# Patient Record
Sex: Male | Born: 1990 | Race: White | Hispanic: No | Marital: Single | Smoking: Current every day smoker
Health system: Southern US, Community
[De-identification: ages and names within clinical notes are randomized; demographics above are authoritative.]

## PROBLEM LIST (undated history)

## (undated) HISTORY — PX: ORIF ANKLE FRACTURE: SUR919

## (undated) HISTORY — PX: HERNIA REPAIR: SHX51

---
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
## 2011-05-04 NOTE — Group Therapy Note (Signed)
[name], [name]          ACCOUNT NO.:  [id number]   MEDICAL RECORD NO.:  [id number]          PATIENT TYPE:  INP   LOCATION:  A335                          FACILITY:  [facility]   PHYSICIAN:  Dorris Singh, DO    DATE OF BIRTH:  05/15/1991   DATE OF PROCEDURE:  07/13/2008  DATE OF DISCHARGE:                                 PROGRESS NOTE   The patient was seen today resting comfortably with family in the room.  Mother states he is feeling a little bit better.  He has not had any  repeat of the chest pain and discussed with him his labs findings which  he is now corrected for hyponatremia.   PHYSICAL EXAMINATION:  VITAL SIGNS:  Are as follows, temperature 98.7,  pulse 81, respirations 20, blood pressure 90/56.  GENERAL:  The patient is an 20 year old Caucasian male who is well-  developed, well-nourished and in no acute distress.  HEART:  Regular rate and rhythm.  LUNGS:  Clear to auscultation bilaterally.  ABDOMEN:  Soft, nontender and nondistended.  EXTREMITIES:  Left extremity positive swelling of the knee but  improvement from yesterday.   LABS:  For today are as follows.  His CBC, his white count is 9.4,  hemoglobin 10.6, hematocrit 30 and platelet count of 233.  His sodium is  136, his potassium is 3.5, chloride is 104, CO2 28, glucose 138, BUN 8  and creatinine 0.70.   ASSESSMENT AND PLAN:  1. Cellulitis in the left knee.  The patient seems to be doing well.  2. Right-sided chest pain.  The patient has not had a repeat episode.      His chest x-ray was negative.  3. Hyponatremia.  This is corrected.  4. Anemia.  He may have more an anemia of hemodilution.  Will go ahead      and turn down his IV rate today and if the patient continues to do      well we will consider signing off tomorrow.   Thank you very much for this fine consult.      Dorris Singh, DO  Electronically Signed     CB/MEDQ  D:  07/13/2008  T:  07/13/2008  Job:  [phone]

## 2011-05-04 NOTE — Group Therapy Note (Signed)
[name], [name]          ACCOUNT NO.:  [id number]   MEDICAL RECORD NO.:  [id number]          PATIENT TYPE:  INP   LOCATION:  A335                          FACILITY:  [facility]   PHYSICIAN:  Dorris Singh, DO    DATE OF BIRTH:  July 03, 1991   DATE OF PROCEDURE:  DATE OF DISCHARGE:                                 PROGRESS NOTE   The patient is seen today, sitting comfortably with his mother in the  room.  Discussed his chest x-ray of yesterday which was negative.  Also  increased the patient's fluid.  He seems to be doing okay with that.   PHYSICAL EXAMINATION:  VITAL SIGNS:  Today temperature 99.8 degrees,  pulse 103, respirations 20, blood pressure 84/54.  GENERAL:  The patient is a well-developed and well-nourished Caucasian  male, who is in no acute distress.  HEART:  Regular rate and rhythm.  LUNGS:  Clear to auscultation bilaterally.  ABDOMEN:  Soft, nontender, nondistended.  EXTREMITIES:  Left leg with positive swelling at the knee, as well as  some redness going up the thigh.   LABORATORY DATA:  White count 13.6, hemoglobin 11.7, hematocrit 35.5,  platelet count 225.   Will go ahead and run a BMET for tomorrow, to check his sodium level.   ASSESSMENT/PLAN:  1. Cellulitis of the left knee:  Continue with Zosyn and vancomycin.  2. Hyponatremia:  Will check to see if it is corrected for tomorrow.  3. Anemia:  Will continue to monitor.  This may be of chronic nature      and due to medications. Will stop H&H's today.  Will continue to      monitor him at that point in time.      Dorris Singh, DO  Electronically Signed     CB/MEDQ  D:  07/12/2008  T:  07/12/2008  Job:  [phone]

## 2011-05-04 NOTE — H&P (Signed)
[name], [name]          ACCOUNT NO.:  [id number]   MEDICAL RECORD NO.:  [id number]          PATIENT TYPE:  INP   LOCATION:  A335                          FACILITY:  [facility]   PHYSICIAN:  J. Darreld Mclean, M.D. DATE OF BIRTH:  Mar 22, 1991   DATE OF ADMISSION:  07/10/2008  DATE OF DISCHARGE:  LH                              HISTORY & PHYSICAL   CHIEF COMPLAINT:  My knee hurts and it is swollen.   HISTORY OF PRESENT ILLNESS:  The patient is an 20 year old white male  who fell and hit his left knee on the 18th.  He was seen in the  emergency room.  X-rays were negative.  I first saw him in the office  yesterday on July 21.  He was complaining of pain and tenderness in the  left knee.  It was swollen and red.  He had induration in the  prepatellar area.  I thought he had prepatellar bursitis.  Attempted an  aspiration, was unable to get any fluid.  I began him on Levaquin and  told his mother if the knee got worse or got redder or had pain, he was  to return.  I was going to plan to see them in the office tomorrow.  Mother called early this morning and said he was having pain and his  knee was worse.   Today, his knee was much redder, and he was having red streaks running  up his medial thigh.  He has no fever, but he is in pain.  His knee  looks worse today.  There is more induration.  No discharge, no  purulence.  Range of motion is decreased.   PAST HISTORY:  Negative.   ALLERGIES:  No known drug allergies.   MEDICATIONS:  1. Dexedrine 45 mg daily.  2. Trileptal 300 mg daily.  3. Seroquel 300 mg daily.  4. __________1 mg b.i.d..   PRIMARY CARE PHYSICIAN:  Dr. Renette Butters is family doctor.  He also [facility] to  [facility].   PAST SURGICAL HISTORY:  Denies any surgery.   PHYSICAL EXAMINATION:  GENERAL:  He is accompanied by his mother and he  lives in [place].  Patient alert, cooperative and oriented.  HEENT:  Negative.  NECK:  Supple.  LUNGS:  Clear to P&A.  HEART:  Regular without murmur.  ABDOMEN:  Soft, nontender without masses.  He is thin.  EXTREMITIES:  Left knee is swollen, red and tender with induration with  medial red streaks coming up and erythema up toward the groin medially.  Range of motion of the knee is 5 degrees to 40 degrees with pain.  The  knee is stable.  No distal edema.  Other extremities are negative.  CNS:  Intact.  SKIN:  Intact.   IMPRESSION:  Left knee cellulitis, prepatellar bursitis.  Probable MRSA.   PLAN:  I have ordered IV vancomycin.  I have ordered a PCA morphine  pump.  I have ordered a PICC line.  Labs are pending.  ______________________________  [name]. Darreld Mclean, M.D.     JWK/MEDQ  D:  07/10/2008  T:  07/10/2008  Job:  [id number]

## 2011-05-04 NOTE — Consult Note (Signed)
[name], [name]          ACCOUNT NO.:  [id number]   MEDICAL RECORD NO.:  [id number]          PATIENT TYPE:  INP   LOCATION:  A335                          FACILITY:  [facility]   PHYSICIAN:  Dorris Singh, DO    DATE OF BIRTH:  Feb 28, 1991   DATE OF CONSULTATION:  07/11/2008  DATE OF DISCHARGE:                                 CONSULTATION   We requested to see the patient for right-sided chest pain.  The patient  is an 20 year old Caucasian male who was admitted to [facility]  after being seen in the office by Dr. Hilda Lias for knee pain.  He was  admitted for left knee cellulitis, prepatellar bursitis with MRSA.  Yesterday, on July 22, the patient had PICC line placement and no  complications after the procedure.  Today, the patient was complaining  of sharp right-sided chest pain that still felt like stabbing in nature;  he had never had it before.  He was concerned about the PICC line  placement.  Denied any shortness of breath associated with it.  He did  get nauseated and was given Maalox and is doing well.   His past medical history:  Has no known drug allergies.  He has a  significant psychiatric history that includes major depression, bipolar  disorder as well as attention deficit disorder.  He also has a history  of asthma and right shoulder pain.  He is currently on Dexedrine 45 mg  p.o. daily, Trileptal 300 mg p.o. daily, Seroquel 300 mg p.o. daily and  __________ 1 mg 2 times p.o. daily.   His surgical history:  He has had an inguinal hernia repair at age 75  months.   His social history:  He lives with his mother and 3 other siblings.  At  this point in time, denies any alcohol use or tobacco use at this point  in time.   The patient's review of systems positive for right-sided chest pain and  nausea and vomiting.  His vital signs are as follows:  Temperature 97, pulse 71, respirations  18, blood pressure 93/53.  GENERAL:  The patient is an 20 year old  Caucasian male who answers  questions appropriately.  He is well nourished in no acute distress.  HEAD:  Is normocephalic, atraumatic.  EYES:  Are PERRL.  EOMI.  MOUTH:  His mucosa is dry.  NECK:  Is supple without any lymphadenopathy.  CHEST:  On palpation of left side of the chest, there is no pain on  palpation.  Right side of the chest, the patient is stating that is  reproducible pain.  His lungs are clear to auscultation bilaterally.  HEART:  Is regular rate and rhythm.  ABDOMEN:  Is soft, nontender, nondistended.  EXTREMITIES:  Left knee is red and swollen.  Right knee is within normal  limits.   His labs for today include CBC 15.  His white count is 15, hemoglobin  11.9, hematocrit 35 and platelet count of 222.  His sodium is 130,  potassium is 4 - this was yesterday.  His chloride is 98, CO2 23,  glucose is 137, BUN  10, creatinine 0.83.   ASSESSMENT AND PLAN:  1. Right-sided chest pain.  2. Anemia, etiology unknown.  3. Hyponatremia.  4. Elevated ESR.   PLAN:  Will go ahead and obtain another chest x-ray for this right sided  chest pain.  I do suspect, though, that it is probably more  musculoskeletal in nature.  Will continue with pain medication and also  place him on a NSAID to help.  If his chest x-ray is negative, will just  continue with the NSAIDs.  Also for his hyponatremia recommend that his  IV rate be increased so this can be corrected. His anemia, do not know  if this is chronic or not.  It is looks like a normocytic, normochromic  anemia.  This may need to be worked up outpatient, and it may be due to  the series of medications that he is currently on.  However, will get  Hemoccult stools to make sure there is not an active bleed based on the  medications that the patient is on as well and will do some serial H&Hs  for 24 hours.   Thank you for this kind consult.      Dorris Singh, DO  Electronically Signed     CB/MEDQ  D:  07/11/2008  T:   07/11/2008  Job:  [id number]
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
## 2011-09-17 LAB — DIFFERENTIAL
Basophils Absolute: 0
Basophils Absolute: 0
Basophils Absolute: 0
Basophils Absolute: 0
Basophils Absolute: 0
Basophils Absolute: 0
Basophils Relative: 0
Basophils Relative: 0
Basophils Relative: 0
Basophils Relative: 0
Basophils Relative: 0
Eosinophils Absolute: 0
Eosinophils Absolute: 0.1
Eosinophils Absolute: 0.2
Eosinophils Absolute: 0.2
Eosinophils Absolute: 0.2
Eosinophils Absolute: 0.2
Eosinophils Relative: 0
Eosinophils Relative: 1
Eosinophils Relative: 1
Eosinophils Relative: 1
Eosinophils Relative: 2
Eosinophils Relative: 2
Lymphocytes Relative: 13
Lymphocytes Relative: 13
Lymphocytes Relative: 14
Lymphocytes Relative: 16
Lymphocytes Relative: 8 — ABNORMAL LOW
Lymphocytes Relative: 8 — ABNORMAL LOW
Lymphocytes Relative: 8 — ABNORMAL LOW
Lymphs Abs: 1.1
Lymphs Abs: 1.1
Lymphs Abs: 1.3
Lymphs Abs: 1.3
Lymphs Abs: 1.4
Lymphs Abs: 1.4
Monocytes Absolute: 1.4 — ABNORMAL HIGH
Monocytes Absolute: 1.4 — ABNORMAL HIGH
Monocytes Absolute: 1.4 — ABNORMAL HIGH
Monocytes Absolute: 1.5 — ABNORMAL HIGH
Monocytes Absolute: 1.5 — ABNORMAL HIGH
Monocytes Relative: 11
Monocytes Relative: 13 — ABNORMAL HIGH
Monocytes Relative: 14 — ABNORMAL HIGH
Monocytes Relative: 15 — ABNORMAL HIGH
Monocytes Relative: 8
Monocytes Relative: 9
Neutro Abs: 10.9 — ABNORMAL HIGH
Neutro Abs: 12.3 — ABNORMAL HIGH
Neutro Abs: 13.8 — ABNORMAL HIGH
Neutro Abs: 6.5
Neutro Abs: 8 — ABNORMAL HIGH
Neutrophils Relative %: 69
Neutrophils Relative %: 70
Neutrophils Relative %: 71
Neutrophils Relative %: 72
Neutrophils Relative %: 80 — ABNORMAL HIGH
Neutrophils Relative %: 82 — ABNORMAL HIGH
Neutrophils Relative %: 83 — ABNORMAL HIGH

## 2011-09-17 LAB — CBC
HCT: 30 — ABNORMAL LOW
HCT: 32.5 — ABNORMAL LOW
HCT: 33.4 — ABNORMAL LOW
HCT: 35 — ABNORMAL LOW
HCT: 35.5 — ABNORMAL LOW
HCT: 37.6 — ABNORMAL LOW
Hemoglobin: 10.6 — ABNORMAL LOW
Hemoglobin: 10.9 — ABNORMAL LOW
Hemoglobin: 11.7 — ABNORMAL LOW
Hemoglobin: 11.9 — ABNORMAL LOW
Hemoglobin: 12.6 — ABNORMAL LOW
MCHC: 33
MCHC: 33.6
MCHC: 33.7
MCHC: 34
MCHC: 35.2
MCV: 85.7
MCV: 88.1
MCV: 88.6
MCV: 88.8
MCV: 90.2
MCV: 93.4
Platelets: 221
Platelets: 222
Platelets: 225
Platelets: 233
Platelets: 270
Platelets: 289
Platelets: 320
RBC: 3.21 — ABNORMAL LOW
RBC: 3.66 — ABNORMAL LOW
RBC: 3.89 — ABNORMAL LOW
RBC: 3.98 — ABNORMAL LOW
RBC: 4 — ABNORMAL LOW
RBC: 4.38
RDW: 13
RDW: 13.2
RDW: 13.6
RDW: 13.7
RDW: 13.9
RDW: 14
WBC: 10
WBC: 11.1 — ABNORMAL HIGH
WBC: 13.6 — ABNORMAL HIGH
WBC: 15 — ABNORMAL HIGH
WBC: 16.6 — ABNORMAL HIGH
WBC: 9.4

## 2011-09-17 LAB — BASIC METABOLIC PANEL
BUN: 10
BUN: 7
BUN: 8
CO2: 23
CO2: 25
CO2: 28
Calcium: 8.5
Calcium: 8.6
Calcium: 9
Chloride: 100
Chloride: 104
Chloride: 98
Creatinine, Ser: 0.7
Creatinine, Ser: 0.71
Creatinine, Ser: 0.83
GFR calc Af Amer: >60
GFR calc Af Amer: >60
GFR calc Af Amer: >60
GFR calc non Af Amer: >60
GFR calc non Af Amer: >60
GFR calc non Af Amer: >60
Glucose, Bld: 117 — ABNORMAL HIGH
Glucose, Bld: 137 — ABNORMAL HIGH
Glucose, Bld: 138 — ABNORMAL HIGH
Potassium: 3.5
Potassium: 3.7
Potassium: 4
Sodium: 130 — ABNORMAL LOW
Sodium: 132 — ABNORMAL LOW
Sodium: 136

## 2011-09-17 LAB — URINALYSIS, ROUTINE W REFLEX MICROSCOPIC
Bilirubin Urine: NEGATIVE
Glucose, UA: NEGATIVE
Hgb urine dipstick: NEGATIVE
Ketones, ur: NEGATIVE
Nitrite: NEGATIVE
Protein, ur: NEGATIVE
Specific Gravity, Urine: 1.01
Urobilinogen, UA: 0.2
pH: 7.5

## 2011-09-17 LAB — HEMOGLOBIN AND HEMATOCRIT, BLOOD
HCT: 31.2 — ABNORMAL LOW
HCT: 32.8 — ABNORMAL LOW
Hemoglobin: 10.7 — ABNORMAL LOW
Hemoglobin: 11 — ABNORMAL LOW

## 2011-09-17 LAB — WOUND CULTURE

## 2011-09-17 LAB — VANCOMYCIN, RANDOM: Vancomycin Rm: 18.1

## 2011-09-17 LAB — SEDIMENTATION RATE
Sed Rate: 75 — ABNORMAL HIGH
Sed Rate: 98 — ABNORMAL HIGH

## 2019-08-04 ENCOUNTER — Encounter: Payer: Self-pay | Admitting: *Deleted

## 2019-08-04 ENCOUNTER — Emergency Department
Admission: EM | Admit: 2019-08-04 | Discharge: 2019-08-04 | Disposition: A | Discharge status: Home / Self Care | Payer: Medicare Other | Attending: Emergency Medicine | Admitting: Emergency Medicine

## 2019-08-04 ENCOUNTER — Emergency Department: Payer: Medicare Other

## 2019-08-04 ENCOUNTER — Other Ambulatory Visit: Payer: Self-pay

## 2019-08-04 DIAGNOSIS — F17200 Nicotine dependence, unspecified, uncomplicated: Secondary | ICD-10-CM | POA: Diagnosis not present

## 2019-08-04 DIAGNOSIS — Y999 Unspecified external cause status: Secondary | ICD-10-CM | POA: Insufficient documentation

## 2019-08-04 DIAGNOSIS — Y9389 Activity, other specified: Secondary | ICD-10-CM | POA: Insufficient documentation

## 2019-08-04 DIAGNOSIS — S82831A Other fracture of upper and lower end of right fibula, initial encounter for closed fracture: Secondary | ICD-10-CM | POA: Diagnosis not present

## 2019-08-04 DIAGNOSIS — Y929 Unspecified place or not applicable: Secondary | ICD-10-CM | POA: Insufficient documentation

## 2019-08-04 DIAGNOSIS — S8991XA Unspecified injury of right lower leg, initial encounter: Secondary | ICD-10-CM | POA: Diagnosis present

## 2019-08-04 DIAGNOSIS — W010XXA Fall on same level from slipping, tripping and stumbling without subsequent striking against object, initial encounter: Secondary | ICD-10-CM | POA: Diagnosis not present

## 2019-08-04 MED ORDER — HYDROCODONE-ACETAMINOPHEN 5-325 MG PO TABS: 2.0000 | ORAL_TABLET | Freq: Four times a day (QID) | ORAL | 0 refills | Status: DC | PRN
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
## 2019-08-04 NOTE — ED Notes (Signed)
Pt to Xray at this time

## 2019-08-04 NOTE — ED Notes (Signed)
MD Forbach at bedside. 
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
## 2019-08-04 NOTE — Discharge Instructions (Addendum)
You have a fracture (a break) in 1 of the bones of your lower leg called the fibula.  You need to not bear any weight on this leg and leave the splint in place.  Keep the splint clean and dry and use crutches to get around.  Keep the leg elevated when possible and you can use ice packs through the splint to help with pain and swelling.  Use over-the-counter pain medication according to label instructions.  It is important that you follow up with orthopedics; call the office of Dr. Roland Rack on Monday to schedule an appointment for this coming week.  Return to the emergency department with new or worsening symptoms that concern you.  Take Norco as prescribed for severe pain. Do not drink alcohol, drive or participate in any other potentially dangerous activities while taking this medication as it may make you sleepy. Do not take this medication with any other sedating medications, either prescription or over-the-counter. If you were prescribed Percocet or Vicodin, do not take these with acetaminophen (Tylenol) as it is already contained within these medications.   This medication is an opiate (or narcotic) pain medication and can be habit forming.  Use it as little as possible to achieve adequate pain control.  Do not use or use it with extreme caution if you have a history of opiate abuse or dependence.  If you are on a pain contract with your primary care doctor or a pain specialist, be sure to let them know you were prescribed this medication today from the [facility] Emergency Department.  This medication is intended for your use only - do not give any to anyone else and keep it in a secure place where nobody else, especially children, have access to it.  It will also cause or worsen constipation, so you may want to consider taking an over-the-counter stool softener while you are taking this medication.
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
## 2021-02-25 ENCOUNTER — Other Ambulatory Visit (HOSPITAL_COMMUNITY): Payer: Medicare Other

## 2021-02-27 ENCOUNTER — Encounter (HOSPITAL_COMMUNITY): Admission: RE | Payer: Self-pay | Source: Home / Self Care

## 2021-02-27 ENCOUNTER — Ambulatory Visit (HOSPITAL_COMMUNITY): Admission: RE | Admit: 2021-02-27 | Payer: Medicare Other | Source: Home / Self Care | Admitting: Oral Surgery

## 2021-02-27 SURGERY — DENTAL RESTORATION/EXTRACTIONS
Anesthesia: General | Laterality: Bilateral

## 2021-03-26 NOTE — Progress Notes (Signed)
I called Mr. Turpen cell phone, patient's mother answered. Mrs. Lancon states that Mr. Kuenzi does not have a cell phone and he is not home.  I gave Mrs. Kohrs the address to take patient to have Covid: Mrs. Rathgeber said she is not taking him that far. Mrs. Wuebker said she will have Mr. Waldman call me.

## 2021-03-26 NOTE — Progress Notes (Addendum)
Mr. [name] well is not at home, I gave the following instructions with his mother, Kaoru Rezendes: NPO after midnight shower, wear clean clothes, no lotions, powders  or cologne, brush teeth. No jewelry or piercing. Patient will need someone to drive him home and stay with him for 24 hours.

## 2021-03-27 ENCOUNTER — Ambulatory Visit (HOSPITAL_COMMUNITY)
Admission: RE | Admit: 2021-03-27 | Discharge: 2021-03-27 | Disposition: A | Discharge status: Home / Self Care | Payer: Medicare Other | Source: Ambulatory Visit | Attending: Oral Surgery | Admitting: Oral Surgery

## 2021-03-27 ENCOUNTER — Ambulatory Visit (HOSPITAL_COMMUNITY): Payer: Medicare Other | Admitting: Certified Registered"

## 2021-03-27 ENCOUNTER — Other Ambulatory Visit: Payer: Self-pay

## 2021-03-27 ENCOUNTER — Encounter (HOSPITAL_COMMUNITY): Payer: Self-pay | Admitting: Oral Surgery

## 2021-03-27 ENCOUNTER — Encounter (HOSPITAL_COMMUNITY)
Admission: RE | Disposition: A | Discharge status: Home / Self Care | Payer: Self-pay | Source: Ambulatory Visit | Attending: Oral Surgery

## 2021-03-27 DIAGNOSIS — K029 Dental caries, unspecified: Secondary | ICD-10-CM | POA: Insufficient documentation

## 2021-03-27 DIAGNOSIS — Z888 Allergy status to other drugs, medicaments and biological substances status: Secondary | ICD-10-CM | POA: Diagnosis not present

## 2021-03-27 DIAGNOSIS — F17200 Nicotine dependence, unspecified, uncomplicated: Secondary | ICD-10-CM | POA: Diagnosis not present

## 2021-03-27 DIAGNOSIS — Z885 Allergy status to narcotic agent status: Secondary | ICD-10-CM | POA: Insufficient documentation

## 2021-03-27 DIAGNOSIS — Z20822 Contact with and (suspected) exposure to covid-19: Secondary | ICD-10-CM | POA: Diagnosis not present

## 2021-03-27 DIAGNOSIS — K011 Impacted teeth: Secondary | ICD-10-CM | POA: Diagnosis not present

## 2021-03-27 DIAGNOSIS — K0530 Chronic periodontitis, unspecified: Secondary | ICD-10-CM | POA: Insufficient documentation

## 2021-03-27 HISTORY — PX: TOOTH EXTRACTION: SHX859

## 2021-03-27 LAB — SARS CORONAVIRUS 2 BY RT PCR (HOSPITAL ORDER, PERFORMED IN ~~LOC~~ HOSPITAL LAB): SARS Coronavirus 2: NEGATIVE

## 2021-03-27 LAB — COMPREHENSIVE METABOLIC PANEL
ALT: 64 U/L — ABNORMAL HIGH (ref 0–44)
AST: 45 U/L — ABNORMAL HIGH (ref 15–41)
Albumin: 4.2 g/dL (ref 3.5–5.0)
Alkaline Phosphatase: 91 U/L (ref 38–126)
Anion gap: 11 (ref 5–15)
BUN: 9 mg/dL (ref 6–20)
CO2: 23 mmol/L (ref 22–32)
Calcium: 9.5 mg/dL (ref 8.9–10.3)
Chloride: 103 mmol/L (ref 98–111)
Creatinine, Ser: 0.94 mg/dL (ref 0.61–1.24)
GFR, Estimated: >60 mL/min (ref >60)
Glucose, Bld: 88 mg/dL (ref 70–99)
Potassium: 4.2 mmol/L (ref 3.5–5.1)
Sodium: 137 mmol/L (ref 135–145)
Total Bilirubin: 0.5 mg/dL (ref 0.3–1.2)
Total Protein: 7 g/dL (ref 6.5–8.1)

## 2021-03-27 SURGERY — DENTAL RESTORATION/EXTRACTIONS
Anesthesia: General

## 2021-03-27 MED ORDER — OXYMETAZOLINE HCL 0.05 % NA SOLN
NASAL | Status: AC
  Filled 2021-03-27: qty 30

## 2021-03-27 MED ORDER — MIDAZOLAM HCL 5 MG/5ML IJ SOLN
INTRAMUSCULAR | Status: DC | PRN
  Administered 2021-03-27: 2 mg via INTRAVENOUS

## 2021-03-27 MED ORDER — GLYCOPYRROLATE PF 0.2 MG/ML IJ SOSY
PREFILLED_SYRINGE | INTRAMUSCULAR | Status: DC | PRN
  Administered 2021-03-27: .4 mg via INTRAVENOUS

## 2021-03-27 MED ORDER — ORAL CARE MOUTH RINSE: 15.0000 mL | Freq: Once | OROMUCOSAL | Status: AC

## 2021-03-27 MED ORDER — PROPOFOL 10 MG/ML IV BOLUS
INTRAVENOUS | Status: DC | PRN
  Administered 2021-03-27: 200 mg via INTRAVENOUS

## 2021-03-27 MED ORDER — ROCURONIUM BROMIDE 10 MG/ML (PF) SYRINGE
PREFILLED_SYRINGE | INTRAVENOUS | Status: DC | PRN
  Administered 2021-03-27: 50 mg via INTRAVENOUS

## 2021-03-27 MED ORDER — OXYCODONE HCL 5 MG/5ML PO SOLN
5.0000 mg | Freq: Once | ORAL | Status: DC | PRN
Start: 2021-03-27 — End: 2021-03-27

## 2021-03-27 MED ORDER — DEXAMETHASONE SODIUM PHOSPHATE 10 MG/ML IJ SOLN
INTRAMUSCULAR | Status: DC | PRN
  Administered 2021-03-27: 10 mg via INTRAVENOUS

## 2021-03-27 MED ORDER — MIDAZOLAM HCL 2 MG/2ML IJ SOLN
INTRAMUSCULAR | Status: AC
  Filled 2021-03-27: qty 2

## 2021-03-27 MED ORDER — CHLORHEXIDINE GLUCONATE 0.12 % MT SOLN: 15.0000 mL | Freq: Once | OROMUCOSAL | Status: AC

## 2021-03-27 MED ORDER — LIDOCAINE 2% (20 MG/ML) 5 ML SYRINGE
INTRAMUSCULAR | Status: DC | PRN
  Administered 2021-03-27: 60 mg via INTRAVENOUS

## 2021-03-27 MED ORDER — ACETAMINOPHEN 500 MG PO TABS
1000.0000 mg | ORAL_TABLET | Freq: Once | ORAL | Status: AC
  Administered 2021-03-27: 1000 mg via ORAL
  Filled 2021-03-27: qty 2

## 2021-03-27 MED ORDER — SUCCINYLCHOLINE CHLORIDE 20 MG/ML IJ SOLN
INTRAMUSCULAR | Status: DC | PRN
  Administered 2021-03-27: 100 mg via INTRAVENOUS

## 2021-03-27 MED ORDER — LIDOCAINE-EPINEPHRINE 2 %-1:100000 IJ SOLN
INTRAMUSCULAR | Status: AC
  Filled 2021-03-27: qty 1

## 2021-03-27 MED ORDER — FENTANYL CITRATE (PF) 250 MCG/5ML IJ SOLN
INTRAMUSCULAR | Status: AC
  Filled 2021-03-27: qty 5

## 2021-03-27 MED ORDER — FENTANYL CITRATE (PF) 100 MCG/2ML IJ SOLN
INTRAMUSCULAR | Status: DC | PRN
  Administered 2021-03-27: 50 ug via INTRAVENOUS
  Administered 2021-03-27: 100 ug via INTRAVENOUS

## 2021-03-27 MED ORDER — EPHEDRINE 5 MG/ML INJ
INTRAVENOUS | Status: AC
  Filled 2021-03-27: qty 10

## 2021-03-27 MED ORDER — SUCCINYLCHOLINE CHLORIDE 200 MG/10ML IV SOSY
PREFILLED_SYRINGE | INTRAVENOUS | Status: AC
  Filled 2021-03-27: qty 10

## 2021-03-27 MED ORDER — NEOSTIGMINE METHYLSULFATE 10 MG/10ML IV SOLN
INTRAVENOUS | Status: DC | PRN
  Administered 2021-03-27: 3 mg via INTRAVENOUS

## 2021-03-27 MED ORDER — SODIUM CHLORIDE 0.9 % IR SOLN
Status: DC | PRN
  Administered 2021-03-27: 1000 mL

## 2021-03-27 MED ORDER — LACTATED RINGERS IV SOLN: INTRAVENOUS | Status: DC

## 2021-03-27 MED ORDER — ONDANSETRON HCL 4 MG/2ML IJ SOLN
INTRAMUSCULAR | Status: DC | PRN
  Administered 2021-03-27: 4 mg via INTRAVENOUS

## 2021-03-27 MED ORDER — EPHEDRINE SULFATE-NACL 50-0.9 MG/10ML-% IV SOSY
PREFILLED_SYRINGE | INTRAVENOUS | Status: DC | PRN
  Administered 2021-03-27: 10 mg via INTRAVENOUS
  Administered 2021-03-27: 5 mg via INTRAVENOUS

## 2021-03-27 MED ORDER — PHENYLEPHRINE 40 MCG/ML (10ML) SYRINGE FOR IV PUSH (FOR BLOOD PRESSURE SUPPORT)
PREFILLED_SYRINGE | INTRAVENOUS | Status: AC
  Filled 2021-03-27: qty 10

## 2021-03-27 MED ORDER — PROPOFOL 10 MG/ML IV BOLUS
INTRAVENOUS | Status: AC
  Filled 2021-03-27: qty 20

## 2021-03-27 MED ORDER — OXYCODONE HCL 5 MG PO TABS: 5.0000 mg | ORAL_TABLET | Freq: Once | ORAL | Status: DC | PRN

## 2021-03-27 MED ORDER — MEPERIDINE HCL 25 MG/ML IJ SOLN: 6.2500 mg | INTRAMUSCULAR | Status: DC | PRN

## 2021-03-27 MED ORDER — CEFAZOLIN SODIUM-DEXTROSE 2-4 GM/100ML-% IV SOLN
2.0000 g | INTRAVENOUS | Status: AC
  Administered 2021-03-27: 2 g via INTRAVENOUS
  Filled 2021-03-27: qty 100

## 2021-03-27 MED ORDER — LIDOCAINE-EPINEPHRINE 2 %-1:100000 IJ SOLN
INTRAMUSCULAR | Status: DC | PRN
  Administered 2021-03-27: 10 mL via INTRADERMAL
  Administered 2021-03-27: 20 mL via INTRADERMAL

## 2021-03-27 MED ORDER — 0.9 % SODIUM CHLORIDE (POUR BTL) OPTIME
TOPICAL | Status: DC | PRN
  Administered 2021-03-27: 1000 mL

## 2021-03-27 MED ORDER — OXYCODONE-ACETAMINOPHEN 5-325 MG PO TABS: 1.0000 | ORAL_TABLET | ORAL | 0 refills | Status: AC | PRN

## 2021-03-27 MED ORDER — HYDROMORPHONE HCL 1 MG/ML IJ SOLN: 0.2500 mg | INTRAMUSCULAR | Status: DC | PRN

## 2021-03-27 MED ORDER — CHLORHEXIDINE GLUCONATE 0.12 % MT SOLN
OROMUCOSAL | Status: AC
  Administered 2021-03-27: 15 mL
  Filled 2021-03-27: qty 15

## 2021-03-27 MED ORDER — LIDOCAINE 2% (20 MG/ML) 5 ML SYRINGE
INTRAMUSCULAR | Status: AC
  Filled 2021-03-27: qty 5

## 2021-03-27 MED ORDER — PROMETHAZINE HCL 25 MG/ML IJ SOLN: 6.2500 mg | INTRAMUSCULAR | Status: DC | PRN

## 2021-03-27 MED ORDER — AMOXICILLIN 500 MG PO CAPS: 500.0000 mg | ORAL_CAPSULE | Freq: Three times a day (TID) | ORAL | 0 refills | Status: AC

## 2021-03-27 MED ORDER — KETOROLAC TROMETHAMINE 30 MG/ML IJ SOLN: 30.0000 mg | Freq: Once | INTRAMUSCULAR | Status: DC | PRN

## 2021-03-27 SURGICAL SUPPLY — 35 items
BLADE SURG 15 STRL LF DISP TIS (BLADE) ×1 IMPLANT
BLADE SURG 15 STRL SS (BLADE) ×2
BUR CROSS CUT FISSURE 1.6 (BURR) ×2 IMPLANT
BUR EGG ELITE 4.0 (BURR) ×2 IMPLANT
CANISTER SUCT 3000ML PPV (MISCELLANEOUS) ×2 IMPLANT
COVER SURGICAL LIGHT HANDLE (MISCELLANEOUS) ×2 IMPLANT
COVER WAND RF STERILE (DRAPES) IMPLANT
DECANTER SPIKE VIAL GLASS SM (MISCELLANEOUS) ×2 IMPLANT
DRAPE U-SHAPE 76X120 STRL (DRAPES) ×2 IMPLANT
GAUZE PACKING FOLDED 2  STR (GAUZE/BANDAGES/DRESSINGS) ×2
GAUZE PACKING FOLDED 2 STR (GAUZE/BANDAGES/DRESSINGS) ×1 IMPLANT
GLOVE BIO SURGEON STRL SZ 6.5 (GLOVE) ×2 IMPLANT
GLOVE BIO SURGEON STRL SZ7 (GLOVE) IMPLANT
GLOVE BIO SURGEON STRL SZ8 (GLOVE) ×2 IMPLANT
GLOVE SURG UNDER POLY LF SZ6.5 (GLOVE) IMPLANT
GLOVE SURG UNDER POLY LF SZ7 (GLOVE) IMPLANT
GOWN STRL REUS W/ TWL LRG LVL3 (GOWN DISPOSABLE) ×1 IMPLANT
GOWN STRL REUS W/ TWL XL LVL3 (GOWN DISPOSABLE) ×1 IMPLANT
GOWN STRL REUS W/TWL LRG LVL3 (GOWN DISPOSABLE) ×2
GOWN STRL REUS W/TWL XL LVL3 (GOWN DISPOSABLE) ×2
IV NS 1000ML (IV SOLUTION) ×2
IV NS 1000ML BAXH (IV SOLUTION) ×1 IMPLANT
KIT BASIN OR (CUSTOM PROCEDURE TRAY) ×2 IMPLANT
KIT TURNOVER KIT B (KITS) ×2 IMPLANT
NEEDLE HYPO 25GX1X1/2 BEV (NEEDLE) ×4 IMPLANT
NS IRRIG 1000ML POUR BTL (IV SOLUTION) ×2 IMPLANT
PAD ARMBOARD 7.5X6 YLW CONV (MISCELLANEOUS) ×2 IMPLANT
SLEEVE IRRIGATION ELITE 7 (MISCELLANEOUS) ×2 IMPLANT
SPONGE SURGIFOAM ABS GEL 12-7 (HEMOSTASIS) IMPLANT
SUT CHROMIC 3 0 PS 2 (SUTURE) ×2 IMPLANT
SYR BULB IRRIG 60ML STRL (SYRINGE) ×2 IMPLANT
SYR CONTROL 10ML LL (SYRINGE) ×2 IMPLANT
TRAY ENT MC OR (CUSTOM PROCEDURE TRAY) ×2 IMPLANT
TUBING IRRIGATION (MISCELLANEOUS) ×2 IMPLANT
YANKAUER SUCT BULB TIP NO VENT (SUCTIONS) ×2 IMPLANT

## 2021-03-27 NOTE — Op Note (Signed)
03/27/2021  10:29 AM  PATIENT:  [name]  30 y.o. male  PRE-OPERATIVE DIAGNOSIS:  DENTAL CARIES/PERIODONTITIS ALL REMAINING TEETH # 3, 4, 5, 6, 7, 8, 9, 10, 11, 12, 13, 14, 15, 17, 18, 19, 20, 21, 22, 23, 25, 26, 27, 28, 29, 31, 32, IMPACTED TOOTH #16.  POST-OPERATIVE DIAGNOSIS:  SAME  PROCEDURE:  Procedure(s): DENTAL EXTRACTION  TEETH # 3, 4, 5, 6, 7, 8, 9, 10, 11, 12, 13, 14, 15, 16 17, 18, 19, 20, 21, 22, 23, 25, 26, 27, 28, 29, 31, 32, ALVEOLOPLASTY RIGHT AND LEFT MAXILLA AND MANDIBLE  SURGEON:  Surgeon(s): Ocie Doyne, DMD  ANESTHESIA:   local and general  EBL:  minimal  DRAINS: none   SPECIMEN:  No Specimen  COUNTS:  YES  PLAN OF CARE: Discharge to home after PACU  PATIENT DISPOSITION:  PACU - hemodynamically stable.   PROCEDURE DETAILS: Dictation #[id number]  [name], DMD 03/27/2021 10:29 AM

## 2021-03-27 NOTE — Progress Notes (Signed)
Discharge criteria met. Patient is awake and alert, vital signs stable. Patient reports no pain at this time. Patients oral oozing seems to have slowed. Gauze replaced, patient instructed to keep gauze in place until arriving home and to call MD if bleeding starts again.  Discharge instructions printed and discussed with patient and patients mother "Mickel Baas". Copy of instructions provided to patient. Prescription medications to be picked up by patients mother on way home. Patient assisted with dressing, peripheral IV removed. Patient escorted to lobby by RN via wheelchair, patients mother to drive patient home.

## 2021-03-27 NOTE — Anesthesia Procedure Notes (Signed)
Procedure Name: Intubation Date/Time: 03/27/2021 9:09 AM Performed by: Lytle Michaels, CRNA Pre-anesthesia Checklist: Patient identified, Emergency Drugs available, Suction available and Patient being monitored Patient Re-evaluated:Patient Re-evaluated prior to induction Oxygen Delivery Method: Circle system utilized Preoxygenation: Pre-oxygenation with 100% oxygen Induction Type: IV induction Ventilation: Mask ventilation without difficulty Laryngoscope Size: Miller and 2 Grade View: Grade I Nasal Tubes: Nasal prep performed, Nasal Rae and Right Tube size: 7.5 mm Number of attempts: 2 (Cuff popped on first attempt) Placement Confirmation: ETT inserted through vocal cords under direct vision,  positive ETCO2 and breath sounds checked- equal and bilateral Secured at: 29 cm Tube secured with: Tape Dental Injury: Teeth and Oropharynx as per pre-operative assessment

## 2021-03-27 NOTE — Anesthesia Postprocedure Evaluation (Signed)
Anesthesia Post Note  Patient: ONEY FOLZ  Procedure(s) Performed: DENTAL RESTORATION/EXTRACTIONS (N/A )     Patient location during evaluation: PACU Anesthesia Type: General Level of consciousness: awake and alert, oriented and patient cooperative Pain management: pain level controlled Vital Signs Assessment: post-procedure vital signs reviewed and stable Respiratory status: spontaneous breathing, nonlabored ventilation and respiratory function stable Cardiovascular status: blood pressure returned to baseline and stable Postop Assessment: no apparent nausea or vomiting Anesthetic complications: no   No complications documented.  Last Vitals:  Vitals:   03/27/21 1110 03/27/21 1120  BP: 129/69 122/73  Pulse: 76 75  Resp: 12 17  Temp: (!) 36.1 C   SpO2: 96% 96%    Last Pain:  Vitals:   03/27/21 1120  TempSrc:   PainSc: 0-No pain                 Lannie Fields

## 2021-03-27 NOTE — Anesthesia Preprocedure Evaluation (Addendum)
Anesthesia Evaluation  Patient identified by MRN, date of birth, ID band Patient awake    Reviewed: Allergy & Precautions, NPO status , Patient's Chart, lab work & pertinent test results  Airway Mallampati: II  TM Distance: >3 FB Neck ROM: Full    Dental  (+) Dental Advisory Given, Loose   Pulmonary Current Smoker,  1ppd x 15 years No inhalers    Pulmonary exam normal breath sounds clear to auscultation       Cardiovascular negative cardio ROS Normal cardiovascular exam Rhythm:Regular Rate:Normal     Neuro/Psych negative neurological ROS  negative psych ROS   GI/Hepatic negative GI ROS, (+)     substance abuse  marijuana use,   Endo/Other  negative endocrine ROS  Renal/GU negative Renal ROS  negative genitourinary   Musculoskeletal negative musculoskeletal ROS (+)   Abdominal Normal abdominal exam  (+)   Peds  Hematology negative hematology ROS (+)   Anesthesia Other Findings dental caries  Reproductive/Obstetrics negative OB ROS                            Anesthesia Physical Anesthesia Plan  ASA: II  Anesthesia Plan: General   Post-op Pain Management:    Induction: Intravenous  PONV Risk Score and Plan: 2 and Ondansetron, Dexamethasone, Midazolam and Treatment may vary due to age or medical condition  Airway Management Planned: Nasal ETT  Additional Equipment: None  Intra-op Plan:   Post-operative Plan: Extubation in OR  Informed Consent: I have reviewed the patients History and Physical, chart, labs and discussed the procedure including the risks, benefits and alternatives for the proposed anesthesia with the patient or authorized representative who has indicated his/her understanding and acceptance.     Dental advisory given  Plan Discussed with: CRNA  Anesthesia Plan Comments:        Anesthesia Quick Evaluation

## 2021-03-27 NOTE — Progress Notes (Signed)
Dr. Barbette Merino called and notified of small amounts of oozing blood from patients mouth. Dr. Barbette Merino instructed RN to place gauze in patients mouth and have patient bite down. Will continue to monitor.

## 2021-03-27 NOTE — Discharge Instructions (Signed)

## 2021-03-27 NOTE — Transfer of Care (Signed)
Immediate Anesthesia Transfer of Care Note  Patient: GAITHER BIEHN  Procedure(s) Performed: DENTAL RESTORATION/EXTRACTIONS (N/A )  Patient Location: PACU  Anesthesia Type:General  Level of Consciousness: drowsy  Airway & Oxygen Therapy: Patient Spontanous Breathing and Patient connected to face mask oxygen  Post-op Assessment: Report given to RN and Post -op Vital signs reviewed and stable  Post vital signs: Reviewed and stable  Last Vitals:  Vitals Value Taken Time  BP 130/70 03/27/21 1041  Temp 36.3 C 03/27/21 1040  Pulse 90 03/27/21 1049  Resp 17 03/27/21 1049  SpO2 100 % 03/27/21 1049  Vitals shown include unvalidated device data.  Last Pain:  Vitals:   03/27/21 1040  TempSrc:   PainSc: 0-No pain      Patients Stated Pain Goal: 5 (03/27/21 0747)  Complications: No complications documented.

## 2021-03-27 NOTE — H&P (Signed)
HISTORY AND PHYSICAL  [name] is a 30 y.o. male patient with CC: [place].   No diagnosis found.  No past medical history on file.  Current Facility-Administered Medications  Medication Dose Route Frequency Provider Last Rate Last Admin  . acetaminophen (TYLENOL) tablet 1,000 mg  1,000 mg Oral Once Lannie Fields, DO       Allergies  Allergen Reactions  . Tramadol Hives and Itching   Active Problems:   * No active hospital problems. *  Vitals: Blood pressure 118/81, pulse 80, temperature 97.6 F (36.4 C), temperature source Oral, resp. rate 18, height 6\' 1"  (1.854 m), weight 102.1 kg, SpO2 96 %. Lab results:No results found for this or any previous visit (from the past 24 hour(s)). Radiology Results: No results found. General appearance: alert, cooperative and no distress Head: Normocephalic, without obvious abnormality, atraumatic Eyes: negative Nose: Nares normal. Septum midline. Mucosa normal. No drainage or sinus tenderness. Throat: Multiple carious and periodontally involved teeth. No purulence, fluctuance, edema, trismus. Pharynx clear. Neck: no adenopathy and supple, symmetrical, trachea midline Resp: clear to auscultation bilaterally Cardio: regular rate and rhythm, S1, S2 normal, no murmur, click, rub or gallop  Assessment: Non-restorable teeth secondary to dental caries and periodontal disease  Plan: Full mouth extractrions with alveoloplasty. GA, Day surgery.   03/27/2021

## 2021-03-28 ENCOUNTER — Encounter (HOSPITAL_COMMUNITY): Payer: Self-pay | Admitting: Oral Surgery

## 2021-03-28 NOTE — Op Note (Signed)
NAMEDELMAN, GOSHORN MEDICAL RECORD NO: [id number] ACCOUNT NO: [id number] DATE OF BIRTH: Mar 12, 1991 FACILITY: [facility] LOCATION: [facility] PHYSICIAN: [name], DDS  Operative Report   DATE OF PROCEDURE: 03/27/2021  PREOPERATIVE DIAGNOSIS:  Dental caries, periodontitis all remaining teeth numbers 3, 4, 5, 6, 7, 8, 9, 10, 11, 12, 13, 14, 15, 17, 18, 19, 20, 21, 22, 23, 25, 26, 27, 28, 29, 31, 32.  Impacted tooth #16.  POSTOPERATIVE DIAGNOSIS:  Dental caries, periodontitis all remaining teeth numbers 3, 4, 5, 6, 7, 8, 9, 10, 11, 12, 13, 14, 15, 17, 18, 19, 20, 21, 22, 23, 25, 26, 27, 28, 29, 31, 32.  Impacted tooth #16.  PROCEDURE:  Extraction of all remaining teeth numbers 3, 4, 5, 6, 7, 8, 9, 10, 11, 12, 13, 14, 15, 16, 17, 18, 19, 20, 21, 22, 23, 25, 26, 27, 28, 29, 31, 32. Alveoplasty right and left maxilla and mandible.  SURGEON:  [name], DDS.  ANESTHESIA:  General, nasal intubation.  Dr. Salvadore Farber, attending.  PROCEDURE:  The patient was taken to the operating room and placed on the table in supine position.  General anesthesia was administered intravenously and nasal endotracheal tube was placed and secured.  The eyes were protected and draped and then the  patient was draped for surgery.  Timeout was performed.  The posterior pharynx was suctioned and a throat pack was placed.  A 2% lidocaine 1:100,000 epinephrine was infiltrated in the inferior alveolar block on the right and left sides and buccal  infiltration in the maxilla around the teeth to be removed.  Palatal infiltration was also administered in the maxilla.  A total of 20 mL was utilized.  A bite block was placed on the right side of the mouth and a sweetheart retractor was used to retract  the tongue.  A #15 blade was used to make an incision beginning at tooth #17 and carried forward in the buccal and lingual sulcus until the midline was reached and teeth #25 and 26 were incised in the sulcus as well, the  periosteum was reflected from  around these teeth.  The teeth were elevated with 301 elevator and removed from the mouth with a dental forceps.  The sockets were then curetted.  The tissue was trimmed and reflected to expose the alveolar crest, which was irregular and had multiple  undercut regions.  Alveoplasty was performed using the egg bur and then the bone file was used to further smooth the area.  The area was irrigated and closed with 3-0 chromic.  The left maxilla was operated.  Next, a 15 blade was used to make an incision  on the gingiva overlying tooth #16 carried forward in the buccal and palatal sulcus from teeth numbers 15, 14, 13, 12, 11, 10, 9, 8 and 7 and then the periosteum was reflected from around these teeth.  Teeth were elevated.  Tooth #16 required removal of  bone with a [employer]ryker handpiece and then it was elevated and removed with a dental 301 elevator.  Tooth #15 was removed with the dental forceps.  Tooth #14 required sectioning to remove the crown and section of the roots and each root was removed  individually.  Then, teeth numbers 13 and 12 were removed with dental forceps.  Tooth #11 was removed by removing circumferential bone using the Stryker handpiece under irrigation and then the tooth was removed with dental forceps.  Teeth numbers 10, 9,  8 and 7 were removed  with forceps in a routine fashion.  Periosteum tissue was trimmed to allow for closure and reflected to expose the alveolar crest, which was irregular in contour and had undercuts.  Alveoplasty was then performed using the egg bur  followed by the bone file then the area was irrigated and closed with 3-0 chromic.  The bite block and sweetheart retractor were repositioned to the side of the mouth.  The mandible was operated on the right side.  A 15 blade was used to make an incision  around teeth numbers 27, 28, 29, 31, 32.  The periosteum was reflected.  The teeth were elevated and removed with dental forceps.  The  tissue was trimmed and the sockets were curetted and then the periosteum was reflected.  The alveolar crest was  irregular going to a missing tooth #30 and undercuts in the anterior region an alveoplasty was performed using the egg bur and then the bone file was used to further smooth the area.  This area was irrigated and closed with 3-0 chromic in the right  maxilla.  Tooth numbers 3, 4, 5, 6.  Incision was made in the sulcus around these teeth.  The teeth were exposed using the periosteal elevator to reflect the periosteum and then the teeth were elevated with 301 elevator.  Teeth numbers 4, 5 and 6 were  removed with dental forceps.  Tooth #3 required removal of the crown and sectioning of the tooth roots to remove each root individually and then after this was achieved, the sockets were curetted on the upper right side.  The tissue was trimmed and  reflected to expose the alveolar crest.  Alveoplasty was required because of bone contour irregularity and so the egg bur followed by the bone file were used to smooth the alveolus.  The oral cavity was irrigated and suctioned and the upper right and a  3-0 chromic was used to close the area.  The oral cavity was inspected and sutures in the upper right and upper left were opened to further smooth the bone where there were some sharp areas remaining in the alveolus.  Then, the upper right and left were  resutured with 3-0 chromic.  Additional local anesthesia was administered and the oral cavity was irrigated and suctioned.  The throat pack was removed.  The patient was left under the care of anesthesia for extubation transported to recovery room with  plans for discharge home three day surgery.  ESTIMATED BLOOD LOSS:  Minimum.  COMPLICATIONS:  None.  SPECIMENS:  None.   PUS D: 03/27/2021 10:36:52 am T: 03/28/2021 2:07:00 am  JOB: [id number]/ [id number]

## 2025-01-16 ENCOUNTER — Ambulatory Visit: Admitting: Family Medicine

## 2025-02-21 ENCOUNTER — Ambulatory Visit: Admitting: Family Medicine
